# Patient Record
Sex: Male | Born: 1985 | Race: Black or African American | Hispanic: No | Marital: Single | Smoking: Former smoker
Health system: Southern US, Community
[De-identification: ages and names within clinical notes are randomized; demographics above are authoritative.]

## PROBLEM LIST (undated history)

## (undated) HISTORY — PX: REPLACEMENT TOTAL HIP W/  RESURFACING IMPLANTS: SUR1222

## (undated) HISTORY — PX: ANKLE SURGERY: SHX546

---

## 2017-12-24 ENCOUNTER — Other Ambulatory Visit: Payer: Self-pay

## 2017-12-24 ENCOUNTER — Emergency Department (HOSPITAL_COMMUNITY)
Admission: EM | Admit: 2017-12-24 | Discharge: 2017-12-24 | Disposition: A | Discharge status: Home / Self Care | Payer: Medicaid - Out of State | Attending: Emergency Medicine | Admitting: Emergency Medicine

## 2017-12-24 ENCOUNTER — Emergency Department (HOSPITAL_COMMUNITY): Payer: Medicaid - Out of State

## 2017-12-24 ENCOUNTER — Encounter (HOSPITAL_COMMUNITY): Payer: Self-pay | Admitting: Emergency Medicine

## 2017-12-24 DIAGNOSIS — R1032 Left lower quadrant pain: Secondary | ICD-10-CM | POA: Diagnosis present

## 2017-12-24 DIAGNOSIS — K5732 Diverticulitis of large intestine without perforation or abscess without bleeding: Secondary | ICD-10-CM | POA: Insufficient documentation

## 2017-12-24 DIAGNOSIS — Z87891 Personal history of nicotine dependence: Secondary | ICD-10-CM | POA: Diagnosis not present

## 2017-12-24 LAB — URINALYSIS, ROUTINE W REFLEX MICROSCOPIC
Bilirubin Urine: NEGATIVE
Glucose, UA: NEGATIVE mg/dL
Hgb urine dipstick: NEGATIVE
Ketones, ur: NEGATIVE mg/dL
LEUKOCYTES UA: NEGATIVE
NITRITE: NEGATIVE
PH: 5 (ref 5.0–8.0)
Protein, ur: NEGATIVE mg/dL
SPECIFIC GRAVITY, URINE: 1.023 (ref 1.005–1.030)

## 2017-12-24 MED ORDER — METRONIDAZOLE 500 MG PO TABS: 500.0000 mg | ORAL_TABLET | Freq: Two times a day (BID) | ORAL | 0 refills | Status: AC

## 2017-12-24 MED ORDER — KETOROLAC TROMETHAMINE 30 MG/ML IJ SOLN
30.0000 mg | Freq: Once | INTRAMUSCULAR | Status: AC
  Administered 2017-12-24: 30 mg via INTRAMUSCULAR
  Filled 2017-12-24: qty 1

## 2017-12-24 MED ORDER — CIPROFLOXACIN HCL 500 MG PO TABS: 500.0000 mg | ORAL_TABLET | Freq: Two times a day (BID) | ORAL | 0 refills | Status: AC

## 2017-12-24 MED ORDER — ONDANSETRON 4 MG PO TBDP: 4.0000 mg | ORAL_TABLET | Freq: Three times a day (TID) | ORAL | 0 refills | Status: DC | PRN

## 2017-12-24 MED ORDER — METRONIDAZOLE 500 MG PO TABS
500.0000 mg | ORAL_TABLET | Freq: Once | ORAL | Status: AC
  Administered 2017-12-24: 500 mg via ORAL
  Filled 2017-12-24: qty 1

## 2017-12-24 MED ORDER — KETOROLAC TROMETHAMINE 30 MG/ML IJ SOLN: 30.0000 mg | Freq: Once | INTRAMUSCULAR | Status: DC

## 2017-12-24 MED ORDER — TRAMADOL HCL 50 MG PO TABS: 50.0000 mg | ORAL_TABLET | Freq: Four times a day (QID) | ORAL | 0 refills | Status: DC | PRN

## 2017-12-24 MED ORDER — CIPROFLOXACIN HCL 250 MG PO TABS
500.0000 mg | ORAL_TABLET | Freq: Once | ORAL | Status: AC
  Administered 2017-12-24: 500 mg via ORAL
  Filled 2017-12-24: qty 2

## 2017-12-24 NOTE — Discharge Instructions (Signed)
We believe your symptoms are caused by diverticulitis.  Most of the time this condition (please read through the included information) can be cured with outpatient antibiotics.  Please take the full course of prescribed medication(s) and follow up with the doctors recommended above. ° °Return to the ED if your abdominal pain worsens or fails to improve, you develop bloody vomiting, bloody diarrhea, you are unable to tolerate fluids due to vomiting, fever greater than 101, or other symptoms that concern you. ° °Take Tramadol as prescribed for severe pain. Do not drink alcohol, drive or participate in any other potentially dangerous activities while taking this medication as it may make you sleepy. Do not take this medication with any other sedating medications, either prescription or over-the-counter.  °  °This medication is an opiate (or narcotic) pain medication and can be habit forming.  Use it as little as possible to achieve adequate pain control.  Do not use or use it with extreme caution if you have a history of opiate abuse or dependence. This medication is intended for your use only - do not give any to anyone else and keep it in a secure place where nobody else, especially children, have access to it.  It will also cause or worsen constipation, so you may want to consider taking an over-the-counter stool softener while you are taking this medication. ° ° °Diverticulitis °Diverticulitis is inflammation or infection of small pouches in your colon that form when you have a condition called diverticulosis. The pouches in your colon are called diverticula. Your colon, or large intestine, is where water is absorbed and stool is formed. °Complications of diverticulitis can include: °Bleeding. °Severe infection. °Severe pain. °Perforation of your colon. °Obstruction of your colon. °CAUSES  °Diverticulitis is caused by bacteria. °Diverticulitis happens when stool becomes trapped in diverticula. This allows bacteria  to grow in the diverticula, which can lead to inflammation and infection. °RISK FACTORS °People with diverticulosis are at risk for diverticulitis. Eating a diet that does not include enough fiber from fruits and vegetables may make diverticulitis more likely to develop. °SYMPTOMS  °Symptoms of diverticulitis may include: °Abdominal pain and tenderness. The pain is normally located on the left side of the abdomen, but may occur in other areas. °Fever and chills. °Bloating. °Cramping. °Nausea. °Vomiting. °Constipation. °Diarrhea. °Blood in your stool. °DIAGNOSIS  °Your health care provider will ask you about your medical history and do a physical exam. You may need to have tests done because many medical conditions can cause the same symptoms as diverticulitis. Tests may include: °Blood tests. °Urine tests. °Imaging tests of the abdomen, including X-rays and CT scans. °When your condition is under control, your health care provider may recommend that you have a colonoscopy. A colonoscopy can show how severe your diverticula are and whether something else is causing your symptoms. °TREATMENT  °Most cases of diverticulitis are mild and can be treated at home. Treatment may include: °Taking over-the-counter pain medicines. °Following a clear liquid diet. °Taking antibiotic medicines by mouth for 7-10 days. °More severe cases may be treated at a hospital. Treatment may include: °Not eating or drinking. °Taking prescription pain medicine. °Receiving antibiotic medicines through an IV tube. °Receiving fluids and nutrition through an IV tube. °Surgery. °HOME CARE INSTRUCTIONS  °Follow your health care provider's instructions carefully. °Follow a full liquid diet or other diet as directed by your health care provider. After your symptoms improve, your health care provider may tell you to change your diet. He or   she may recommend you eat a high-fiber diet. Fruits and vegetables are good sources of fiber. Fiber makes it easier  to pass stool. °Take fiber supplements or probiotics as directed by your health care provider. °Only take medicines as directed by your health care provider. °Keep all your follow-up appointments. °SEEK MEDICAL CARE IF:  °Your pain does not improve. °You have a hard time eating food. °Your bowel movements do not return to normal. °SEEK IMMEDIATE MEDICAL CARE IF:  °Your pain becomes worse. °Your symptoms do not get better. °Your symptoms suddenly get worse. °You have a fever. °You have repeated vomiting. °You have bloody or black, tarry stools. °MAKE SURE YOU:  °Understand these instructions. °Will watch your condition. °Will get help right away if you are not doing well or get worse. °Document Released: 11/18/2004 Document Revised: 02/13/2013 Document Reviewed: 01/03/2013 °[employer]® Patient Information ©2015 [employer]. This information is not intended to replace advice given to you by your health care provider. Make sure you discuss any questions you have with your health care provider. ° ° °

## 2017-12-24 NOTE — ED Provider Notes (Signed)
Emergency Department Provider Note   I have reviewed the triage vital signs and the nursing notes.   HISTORY  Chief Complaint Back Pain (L back pain)   HPI Jack Myers is a 32 y.o. male with PMH of hip surgery presents to the emergency department for evaluation of left lower quadrant/flank pain.  Symptoms have been worsening over the past 2 days.  Denies specific back pain or radiation of symptoms down the leg.  No numbness or weakness.  Patient states he has taken several pain medications yesterday with no lasting relief in symptoms.  Denies any dysuria, hesitancy, urgency.  Has not experienced any hematuria.  No prior history of kidney stone. Denies diarrhea symptoms are vomiting  History reviewed. No pertinent past medical history.  There are no active problems to display for this patient.   Past Surgical History:  Procedure Laterality Date  . ANKLE SURGERY    . REPLACEMENT TOTAL HIP W/  RESURFACING IMPLANTS      Allergies Penicillins  History reviewed. No pertinent family history.  Social History Social History   Tobacco Use  . Smoking status: Former Games developer  . Smokeless tobacco: Never Used  Substance Use Topics  . Alcohol use: Yes  . Drug use: Not Currently    Review of Systems  Constitutional: No fever/chills Eyes: No visual changes. ENT: No sore throat. Cardiovascular: Denies chest pain. Respiratory: Denies shortness of breath. Gastrointestinal: Positive LLQ abdominal pain.  No nausea, no vomiting.  No diarrhea.  No constipation. Genitourinary: Negative for dysuria. Musculoskeletal: Negative for back pain. Skin: Negative for rash. Neurological: Negative for headaches, focal weakness or numbness.  10-point ROS otherwise negative.  ____________________________________________   PHYSICAL EXAM:  VITAL SIGNS: ED Triage Vitals  Enc Vitals Group     BP 12/24/17 1515 131/71     Pulse Rate 12/24/17 1515 86     Resp 12/24/17 1515 18     Temp  12/24/17 1515 99.2 F (37.3 C)     Temp Source 12/24/17 1515 Oral     SpO2 12/24/17 1515 99 %     Weight 12/24/17 1516 240 lb (108.9 kg)     Height 12/24/17 1516 5\' 6"  (1.676 m)     Pain Score 12/24/17 1516 10   Constitutional: Alert and oriented. Well appearing and in no acute distress. Eyes: Conjunctivae are normal.  Head: Atraumatic. Nose: No congestion/rhinnorhea. Mouth/Throat: Mucous membranes are moist.  Neck: No stridor.   Cardiovascular: Normal rate, regular rhythm. Good peripheral circulation. Grossly normal heart sounds.   Respiratory: Normal respiratory effort.  No retractions. Lungs CTAB. Gastrointestinal: Soft with focal LLQ tenderness along with inguinal crease and left flank. No CVA tenderness to percussion. No distention.  Musculoskeletal: No lower extremity tenderness nor edema. No gross deformities of extremities. No midline thoracic or lumbar spine tenderness.  Neurologic:  Normal speech and language. No gross focal neurologic deficits are appreciated.  Skin:  Skin is warm, dry and intact. No rash noted.  ____________________________________________   LABS (all labs ordered are listed, but only abnormal results are displayed)  Labs Reviewed  URINALYSIS, ROUTINE W REFLEX MICROSCOPIC   ____________________________________________  RADIOLOGY  Ct Renal Stone Study  Result Date: 12/24/2017 CLINICAL DATA:  Left-sided flank pain for 2 days EXAM: CT ABDOMEN AND PELVIS WITHOUT CONTRAST TECHNIQUE: Multidetector CT imaging of the abdomen and pelvis was performed following the standard protocol without IV contrast. COMPARISON:  None. FINDINGS: Lower chest: Mild bibasilar atelectatic changes are noted without sizable effusion. Hepatobiliary: No  focal liver abnormality is seen. No gallstones, gallbladder wall thickening, or biliary dilatation. Pancreas: Unremarkable. No pancreatic ductal dilatation or surrounding inflammatory changes. Spleen: Normal in size without focal  abnormality. Adrenals/Urinary Tract: Adrenal glands are within normal limits bilaterally. Kidneys are well visualized bilaterally without evidence of renal calculi or obstructive changes. The bladder is decompressed. Stomach/Bowel: Changes of diverticulitis are noted in the junction of the descending and sigmoid colon. No abscess or perforation is noted. The appendix is within normal limits. No small bowel abnormality is seen. Vascular/Lymphatic: No significant vascular findings are present. No enlarged abdominal or pelvic lymph nodes. Reproductive: Prostate is unremarkable. Other: No abdominal wall hernia or abnormality. No abdominopelvic ascites. Musculoskeletal: Postsurgical changes are noted in the proximal right femur as well as the acetabulum on the left. IMPRESSION: Changes consistent with diverticulitis at the junction of descending and sigmoid colon. No perforation or abscess formation is noted. Mild bibasilar atelectatic changes. Electronically Signed   By: Alcide Clever M.D.   On: 12/24/2017 17:24    ____________________________________________   PROCEDURES  Procedure(s) performed:   Procedures  None ____________________________________________   INITIAL IMPRESSION / ASSESSMENT AND PLAN / ED COURSE  Pertinent labs & imaging results that were available during my care of the patient were reviewed by me and considered in my medical decision making (see chart for details).  Patient presents to the emergency department for evaluation of left lower quadrant abdominal/flank pain.  He has tenderness on exam.  I do not appreciate any bruising over this area.  The tenderness does seem more superficial but difficult to evaluate.  Patient does not have lower back pain as listed in his chief complaint.  He is not experiencing any sciatica symptoms.  I did discuss red flag symptoms that would suggest acute spine emergency and these were negative. Plan for UA and CT renal. No testicular pain  symptoms.   5:41 PM Scan reviewed which shows acute, uncomplicated diverticulitis.  Patient is very well-appearing.  I discussed the diagnosis with him.  Plan for Cipro and Flagyl at home along with pain and nausea medication.  I did provide contact information for a PCP as well as local gastroenterology and encouraged him to call these providers on Monday to schedule outpatient appointments moving forward.  We also discussed the signs and symptoms of worsening/complicated diverticulitis.   At this time, I do not feel there is any life-threatening condition present. I have reviewed and discussed all results (EKG, imaging, lab, urine as appropriate), exam findings with patient. I have reviewed nursing notes and appropriate previous records.  I feel the patient is safe to be discharged home without further emergent workup. Discussed usual and customary return precautions. Patient and family (if present) verbalize understanding and are comfortable with this plan.  Patient will follow-up with their primary care provider. If they do not have a primary care provider, information for follow-up has been provided to them. All questions have been answered.  ____________________________________________  FINAL CLINICAL IMPRESSION(S) / ED DIAGNOSES  Final diagnoses:  Diverticulitis of large intestine without perforation or abscess without bleeding     MEDICATIONS GIVEN DURING THIS VISIT:  Medications  ciprofloxacin (CIPRO) tablet 500 mg (has no administration in time range)  metroNIDAZOLE (FLAGYL) tablet 500 mg (has no administration in time range)  ketorolac (TORADOL) 30 MG/ML injection 30 mg (30 mg Intramuscular Given 12/24/17 1619)     NEW OUTPATIENT MEDICATIONS STARTED DURING THIS VISIT:  New Prescriptions   CIPROFLOXACIN (CIPRO) 500 MG TABLET  Take 1 tablet (500 mg total) by mouth 2 (two) times daily for 7 days.   METRONIDAZOLE (FLAGYL) 500 MG TABLET    Take 1 tablet (500 mg total) by mouth 2  (two) times daily for 7 days.   ONDANSETRON (ZOFRAN ODT) 4 MG DISINTEGRATING TABLET    Take 1 tablet (4 mg total) by mouth every 8 (eight) hours as needed for nausea or vomiting.   TRAMADOL (ULTRAM) 50 MG TABLET    Take 1 tablet (50 mg total) by mouth every 6 (six) hours as needed for severe pain.    Note:  This document was prepared using Dragon voice recognition software and may include unintentional dictation errors.  Alona Bene, MD Emergency Medicine    Zaida Reiland, Arlyss Repress, MD 12/24/17 [phone]

## 2017-12-24 NOTE — ED Triage Notes (Signed)
2 day hx of back pain to the left side   States "took a cocktail" of pain meds yesterday  Is followed in [place]  Denies any urinary sx

## 2018-01-09 ENCOUNTER — Other Ambulatory Visit: Payer: Self-pay

## 2018-01-09 ENCOUNTER — Emergency Department (HOSPITAL_COMMUNITY)
Admission: EM | Admit: 2018-01-09 | Discharge: 2018-01-09 | Disposition: A | Discharge status: Home / Self Care | Payer: Medicaid - Out of State | Attending: Emergency Medicine | Admitting: Emergency Medicine

## 2018-01-09 ENCOUNTER — Encounter (HOSPITAL_COMMUNITY): Payer: Self-pay | Admitting: Emergency Medicine

## 2018-01-09 DIAGNOSIS — J111 Influenza due to unidentified influenza virus with other respiratory manifestations: Secondary | ICD-10-CM

## 2018-01-09 DIAGNOSIS — Z87891 Personal history of nicotine dependence: Secondary | ICD-10-CM | POA: Insufficient documentation

## 2018-01-09 DIAGNOSIS — Z79899 Other long term (current) drug therapy: Secondary | ICD-10-CM | POA: Diagnosis not present

## 2018-01-09 DIAGNOSIS — R69 Illness, unspecified: Secondary | ICD-10-CM

## 2018-01-09 DIAGNOSIS — Z96649 Presence of unspecified artificial hip joint: Secondary | ICD-10-CM | POA: Insufficient documentation

## 2018-01-09 DIAGNOSIS — R52 Pain, unspecified: Secondary | ICD-10-CM | POA: Diagnosis present

## 2018-01-09 MED ORDER — IBUPROFEN 800 MG PO TABS: 800.0000 mg | ORAL_TABLET | Freq: Three times a day (TID) | ORAL | 0 refills | Status: DC

## 2018-01-09 MED ORDER — OSELTAMIVIR PHOSPHATE 75 MG PO CAPS: 75.0000 mg | ORAL_CAPSULE | Freq: Two times a day (BID) | ORAL | 0 refills | Status: DC

## 2018-01-09 NOTE — Discharge Instructions (Addendum)
Drink plenty of fluids.  Tylenol every 4 hours.  Take the Tamiflu as directed until its finished.  Follow-up with 1 of the clinics listed if needed or return to the ER for any worsening symptoms

## 2018-01-09 NOTE — ED Triage Notes (Signed)
Pt C/O body aches, fever, and sore throat. Pt states family member in the house is going through same.

## 2018-01-10 NOTE — ED Provider Notes (Signed)
[facility] Provider Note   CSN: [id number] Arrival date & time: 01/09/18  1931     History   Chief Complaint Chief Complaint  Patient presents with  . Influenza    HPI Jack Myers is a 32 y.o. male.  HPI  Jack Myers is a 32 y.o. male who presents to the Emergency Department complaining of generalized body aches, intermittent fever, chills, and sweats, and sore throat.  Family members also here with similar symptoms.  Symptoms began 2 days ago.  He also reports intermittent cough that is nonproductive.  No shortness of breath, abdominal pain, or vomiting.  He has not tried any over-the-counter medications for symptomatic relief.  He states he is still eating and drinking normally.  Nothing makes his symptoms better or worse.     History reviewed. No pertinent past medical history.  There are no active problems to display for this patient.   Past Surgical History:  Procedure Laterality Date  . ANKLE SURGERY    . REPLACEMENT TOTAL HIP W/  RESURFACING IMPLANTS       Home Medications    Prior to Admission medications   Medication Sig Start Date End Date Taking? Authorizing Provider  ibuprofen (ADVIL,MOTRIN) 800 MG tablet Take 1 tablet (800 mg total) by mouth 3 (three) times daily. 01/09/18   Liridona Mashaw, PA-C  ondansetron (ZOFRAN ODT) 4 MG disintegrating tablet Take 1 tablet (4 mg total) by mouth every 8 (eight) hours as needed for nausea or vomiting. 12/24/17   Long, Arlyss RepressJoshua G, MD  oseltamivir (TAMIFLU) 75 MG capsule Take 1 capsule (75 mg total) by mouth 2 (two) times daily. 01/09/18   Lakeria Starkman, PA-C  traMADol (ULTRAM) 50 MG tablet Take 1 tablet (50 mg total) by mouth every 6 (six) hours as needed for severe pain. 12/24/17   Long, Arlyss RepressJoshua G, MD    Family History No family history on file.  Social History Social History   Tobacco Use  . Smoking status: Former Games developermoker  . Smokeless tobacco: Never Used  Substance Use Topics  .  Alcohol use: Yes  . Drug use: Not Currently     Allergies   Penicillins   Review of Systems Review of Systems  Constitutional: Negative for activity change, appetite change, chills and fever.  HENT: Positive for congestion and sore throat. Negative for facial swelling, rhinorrhea, trouble swallowing and voice change.   Eyes: Negative for visual disturbance.  Respiratory: Positive for cough. Negative for shortness of breath, wheezing and stridor.   Cardiovascular: Negative for chest pain.  Gastrointestinal: Negative for abdominal pain, nausea and vomiting.  Genitourinary: Negative for dysuria and flank pain.  Musculoskeletal: Positive for myalgias. Negative for arthralgias, neck pain and neck stiffness.  Skin: Negative for rash.  Neurological: Negative for dizziness, weakness, numbness and headaches.  Hematological: Negative for adenopathy.  Psychiatric/Behavioral: Negative for confusion.     Physical Exam Updated Vital Signs BP (!) 152/92 (BP Location: Right Arm)   Pulse 91   Temp 98.1 F (36.7 C) (Oral)   Resp 14   SpO2 97%   Physical Exam  Constitutional: He is oriented to person, place, and time. He appears well-developed and well-nourished. No distress.  HENT:  Head: Normocephalic and atraumatic.  Right Ear: Tympanic membrane and ear canal normal.  Left Ear: Tympanic membrane and ear canal normal.  Nose: Mucosal edema and rhinorrhea present.  Mouth/Throat: Uvula is midline and mucous membranes are normal. No trismus in the jaw. No uvula swelling. Posterior  oropharyngeal erythema present. No oropharyngeal exudate, posterior oropharyngeal edema or tonsillar abscesses.  Eyes: Conjunctivae are normal.  Neck: Normal range of motion and phonation normal. Neck supple.  Cardiovascular: Normal rate, regular rhythm and intact distal pulses.  No murmur heard. Pulmonary/Chest: Effort normal and breath sounds normal. No respiratory distress. He has no wheezes. He has no rales.    Abdominal: Soft. He exhibits no distension. There is no tenderness. There is no rebound and no guarding.  Musculoskeletal: Normal range of motion. He exhibits no edema.  Lymphadenopathy:    He has no cervical adenopathy.  Neurological: He is alert and oriented to person, place, and time. He exhibits normal muscle tone. Coordination normal.  Skin: Skin is warm. No rash noted.  Psychiatric: He has a normal mood and affect.  Nursing note and vitals reviewed.    ED Treatments / Results  Labs (all labs ordered are listed, but only abnormal results are displayed) Labs Reviewed - No data to display  EKG None  Radiology No results found.  Procedures Procedures (including critical care time)  Medications Ordered in ED Medications - No data to display   Initial Impression / Assessment and Plan / ED Course  I have reviewed the triage vital signs and the nursing notes.  Pertinent labs & imaging results that were available during my care of the patient were reviewed by me and considered in my medical decision making (see chart for details).     Patient nontoxic-appearing.  Vitals reassuring.  Patient's family member here for evaluation of flulike symptoms.  Patient requesting prescription for Tamiflu.  I feel that his symptoms are likely viral.  He appears appropriate for discharge home.  Return precautions discussed.  Final Clinical Impressions(s) / ED Diagnoses   Final diagnoses:  Influenza-like illness    ED Discharge Orders         Ordered    oseltamivir (TAMIFLU) 75 MG capsule  2 times daily     01/09/18 2044    ibuprofen (ADVIL,MOTRIN) 800 MG tablet  3 times daily     01/09/18 2044           Pauline Aus, PA-C 01/10/18 1651    Long, Arlyss Repress, MD 01/11/18 [phone]

## 2018-03-06 ENCOUNTER — Emergency Department (HOSPITAL_COMMUNITY)
Admission: EM | Admit: 2018-03-06 | Discharge: 2018-03-07 | Disposition: A | Discharge status: Home / Self Care | Payer: Medicaid - Out of State | Attending: Emergency Medicine | Admitting: Emergency Medicine

## 2018-03-06 ENCOUNTER — Emergency Department (HOSPITAL_COMMUNITY): Payer: Medicaid - Out of State

## 2018-03-06 ENCOUNTER — Other Ambulatory Visit: Payer: Self-pay

## 2018-03-06 ENCOUNTER — Encounter (HOSPITAL_COMMUNITY): Payer: Self-pay | Admitting: Emergency Medicine

## 2018-03-06 DIAGNOSIS — Z96649 Presence of unspecified artificial hip joint: Secondary | ICD-10-CM | POA: Insufficient documentation

## 2018-03-06 DIAGNOSIS — M25552 Pain in left hip: Secondary | ICD-10-CM | POA: Diagnosis not present

## 2018-03-06 DIAGNOSIS — M79605 Pain in left leg: Secondary | ICD-10-CM | POA: Diagnosis present

## 2018-03-06 DIAGNOSIS — Z87891 Personal history of nicotine dependence: Secondary | ICD-10-CM | POA: Insufficient documentation

## 2018-03-06 DIAGNOSIS — M25562 Pain in left knee: Secondary | ICD-10-CM | POA: Insufficient documentation

## 2018-03-06 MED ORDER — KETOROLAC TROMETHAMINE 30 MG/ML IJ SOLN
30.0000 mg | Freq: Once | INTRAMUSCULAR | Status: AC
  Administered 2018-03-07: 30 mg via INTRAMUSCULAR
  Filled 2018-03-06: qty 1

## 2018-03-06 MED ORDER — IBUPROFEN 800 MG PO TABS: 800.0000 mg | ORAL_TABLET | Freq: Three times a day (TID) | ORAL | 0 refills | Status: DC

## 2018-03-06 MED ORDER — HYDROCODONE-ACETAMINOPHEN 5-325 MG PO TABS: 1.0000 | ORAL_TABLET | ORAL | 0 refills | Status: AC | PRN

## 2018-03-06 NOTE — ED Triage Notes (Signed)
Pt C/O left hip and knee pain that this morning. Pt denies injury.

## 2018-03-07 DIAGNOSIS — M25552 Pain in left hip: Secondary | ICD-10-CM | POA: Diagnosis not present

## 2018-03-07 NOTE — ED Notes (Signed)
Pt ambulatory to waiting room. Pt verbalized understanding of discharge instructions.   
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
## 2018-03-07 NOTE — Discharge Instructions (Addendum)
As discussed your x-rays are negative tonight for any obvious source of your pain symptoms.  The bones and your surgical hardware appear intact and healthy.  You may use the crutches given for minimization of pain in this leg.  You may take the medications prescribed for pain and inflammation.  Call your doctor for recheck of your symptoms and/or a referral back to your orthopedist if your symptoms do not resolve with this treatment.  Do not drive within 4 hours of taking hydrocodone as this medication will make you drowsy.

## 2018-03-07 NOTE — ED Notes (Signed)
Pt given pre pak of norco.

## 2018-03-09 MED FILL — Hydrocodone-Acetaminophen Tab 5-325 MG: ORAL | Qty: 6 | Status: AC

## 2019-10-29 ENCOUNTER — Emergency Department (HOSPITAL_COMMUNITY)
Admission: EM | Admit: 2019-10-29 | Discharge: 2019-10-29 | Disposition: A | Discharge status: Home / Self Care | Payer: Medicaid - Out of State | Attending: Emergency Medicine | Admitting: Emergency Medicine

## 2019-10-29 ENCOUNTER — Other Ambulatory Visit: Payer: Self-pay

## 2019-10-29 ENCOUNTER — Encounter (HOSPITAL_COMMUNITY): Payer: Self-pay | Admitting: *Deleted

## 2019-10-29 DIAGNOSIS — M25552 Pain in left hip: Secondary | ICD-10-CM | POA: Diagnosis present

## 2019-10-29 DIAGNOSIS — G8929 Other chronic pain: Secondary | ICD-10-CM | POA: Insufficient documentation

## 2019-10-29 DIAGNOSIS — M25572 Pain in left ankle and joints of left foot: Secondary | ICD-10-CM | POA: Insufficient documentation

## 2019-10-29 DIAGNOSIS — Z87891 Personal history of nicotine dependence: Secondary | ICD-10-CM | POA: Diagnosis not present

## 2019-10-29 MED ORDER — DICLOFENAC SODIUM 75 MG PO TBEC: 75.0000 mg | DELAYED_RELEASE_TABLET | Freq: Two times a day (BID) | ORAL | 0 refills | Status: DC

## 2019-10-29 MED ORDER — DICLOFENAC SODIUM 75 MG PO TBEC: 75.0000 mg | DELAYED_RELEASE_TABLET | Freq: Two times a day (BID) | ORAL | 0 refills | Status: AC

## 2019-10-29 NOTE — ED Provider Notes (Signed)
[facility] EMERGENCY DEPARTMENT Provider Note   CSN: [id number] Arrival date & time: 10/29/19  1007     History Chief Complaint  Patient presents with  . Hip Pain    Jack Myers is a 34 y.o. male.  HPI      Jack Myers is a 34 y.o. male with past medical history of orthopedic repair of a left acetabular fracture and surgical pinning of the left ankle in 2014 who presents to the Emergency Department complaining of gradually worsening pain of his ankle and hip for 1 month.  He states that he has ongoing pain since his surgery, but has not been able to see his primary care provider due to loss of insurance and his orthopedic provider has released him.  He denies recent injury.  He states he has a dull pain to the lateral left hip and left ankle that is constant, but worsens with certain movements and weightbearing.  He denies abdominal pain, fever, chills, back pain, swelling or redness of the hip or ankle, urine or bowel changes.  He also describes having intermittent numbness and tingling to the toes of the left foot.  This is positional.    History reviewed. No pertinent past medical history.  There are no problems to display for this patient.   Past Surgical History:  Procedure Laterality Date  . ANKLE SURGERY    . REPLACEMENT TOTAL HIP W/  RESURFACING IMPLANTS       History reviewed. No pertinent family history.  Social History   Tobacco Use  . Smoking status: Former Games developer  . Smokeless tobacco: Never Used  Vaping Use  . Vaping Use: Never used  Substance Use Topics  . Alcohol use: Yes  . Drug use: Not Currently    Home Medications Prior to Admission medications   Medication Sig Start Date End Date Taking? Authorizing Provider  HYDROcodone-acetaminophen (NORCO/VICODIN) 5-325 MG tablet Take 1 tablet by mouth every 4 (four) hours as needed. 03/06/18   Burgess Amor, PA-C  HYDROcodone-acetaminophen (NORCO/VICODIN) 5-325 MG tablet Take 1 tablet by mouth every 4  (four) hours as needed. 03/06/18   Idol, Raynelle Fanning, PA-C  ibuprofen (ADVIL,MOTRIN) 800 MG tablet Take 1 tablet (800 mg total) by mouth 3 (three) times daily. 03/06/18   Burgess Amor, PA-C    Allergies    Penicillins  Review of Systems   Review of Systems  Constitutional: Negative for chills and fever.  Respiratory: Negative for shortness of breath.   Cardiovascular: Negative for chest pain.  Gastrointestinal: Negative for abdominal pain, nausea and vomiting.  Genitourinary: Negative for difficulty urinating, dysuria and flank pain.  Musculoskeletal: Positive for arthralgias (left hip and ankle pain). Negative for back pain, joint swelling and neck pain.  Skin: Negative for color change, rash and wound.  Neurological: Negative for dizziness and weakness.    Physical Exam Updated Vital Signs BP 117/66 (BP Location: Right Arm)   Pulse 65   Temp 99 F (37.2 C) (Oral)   Resp 18   Ht 5\' 6"  (1.676 m)   Wt 71.2 kg   SpO2 97%   BMI 25.34 kg/m   Physical Exam Vitals and nursing note reviewed.  Constitutional:      Appearance: Normal appearance. He is not ill-appearing or toxic-appearing.     Comments: Pt smells of marijuana  Cardiovascular:     Rate and Rhythm: Normal rate and regular rhythm.     Pulses: Normal pulses.  Pulmonary:     Effort: Pulmonary effort is  normal. No respiratory distress.     Breath sounds: Normal breath sounds.  Abdominal:     General: There is no distension.     Palpations: Abdomen is soft.     Tenderness: There is no abdominal tenderness. There is no right CVA tenderness or left CVA tenderness.  Musculoskeletal:        General: Tenderness present. No swelling.     Right lower leg: No edema.     Left lower leg: No edema.     Comments: ttp of the lateral left hip.  Pain with external rotation and click is present.  No rash, erythema or excessive warmth of the joint.  Diffuse ttp of the medial and lateral left ankle.  Well healed surgical scar of the medial  ankle.  No edema or erythema  Skin:    General: Skin is warm.     Capillary Refill: Capillary refill takes less than 2 seconds.     Findings: No erythema or rash.  Neurological:     General: No focal deficit present.     Mental Status: He is alert.     Sensory: No sensory deficit.     Motor: No weakness.     ED Results / Procedures / Treatments   Labs (all labs ordered are listed, but only abnormal results are displayed) Labs Reviewed - No data to display  EKG None  Radiology No results found.  Procedures Procedures (including critical care time)  Medications Ordered in ED Medications - No data to display  ED Course  I have reviewed the triage vital signs and the nursing notes.  Pertinent labs & imaging results that were available during my care of the patient were reviewed by me and considered in my medical decision making (see chart for details).    MDM Rules/Calculators/A&P                          Pt here with likely acute on chronic left hip and ankle pain.  NV intact.  No recent injuries, no concerning exam findings to suggest septic joint.    On review of medical records, he had imaging of the left hip in 02/2018 that showed intact hardware.  No exam findings today that suggest need for repeat imaging.  He states that he has a PCP in [place], he does not currently see pain management and he states he has been released from his orthopedic provider.  I feel that he would likely benefit from seeing pain management.  I have discussed this with him and he agrees to arrange follow-up.  He appears appropriate for discharge home, doubt emergent process.  Return precautions discussed.  Final Clinical Impression(s) / ED Diagnoses Final diagnoses:  Chronic left hip pain  Chronic pain of left ankle    Rx / DC Orders ED Discharge Orders    None       Pauline Aus, PA-C 10/29/19 1245    Raeford Razor, MD 10/30/19 1018

## 2019-10-29 NOTE — ED Notes (Signed)
Pt reports hip replacement previously  Has seen his PCP in [place] as well as an ortho he was referred to   Pt has pungent odor of marijuana   Has put on an exam glove over his R hand  Here for eval

## 2019-10-29 NOTE — ED Triage Notes (Signed)
Pt c/o left hip and ankle pain x one month; pt states he has had a hip replacement to same hip in the past

## 2019-10-29 NOTE — Discharge Instructions (Signed)
Follow-up with your primary care provider or you may try contacting your orthopedic surgeon at [facility]. You may benefit from seeing a pain management provider.

## 2020-04-20 ENCOUNTER — Other Ambulatory Visit: Payer: Self-pay

## 2020-04-20 ENCOUNTER — Emergency Department (HOSPITAL_COMMUNITY)
Admission: EM | Admit: 2020-04-20 | Discharge: 2020-04-20 | Disposition: A | Discharge status: Home / Self Care | Payer: Medicaid - Out of State | Attending: Emergency Medicine | Admitting: Emergency Medicine

## 2020-04-20 ENCOUNTER — Encounter (HOSPITAL_COMMUNITY): Payer: Self-pay

## 2020-04-20 DIAGNOSIS — F419 Anxiety disorder, unspecified: Secondary | ICD-10-CM | POA: Insufficient documentation

## 2020-04-20 DIAGNOSIS — Z96649 Presence of unspecified artificial hip joint: Secondary | ICD-10-CM | POA: Insufficient documentation

## 2020-04-20 DIAGNOSIS — Z87891 Personal history of nicotine dependence: Secondary | ICD-10-CM | POA: Diagnosis not present

## 2020-04-20 DIAGNOSIS — R0602 Shortness of breath: Secondary | ICD-10-CM | POA: Insufficient documentation

## 2020-04-20 DIAGNOSIS — R110 Nausea: Secondary | ICD-10-CM | POA: Diagnosis not present

## 2020-04-20 DIAGNOSIS — R079 Chest pain, unspecified: Secondary | ICD-10-CM | POA: Insufficient documentation

## 2020-04-20 MED ORDER — HYDROXYZINE HCL 25 MG PO TABS: 25.0000 mg | ORAL_TABLET | Freq: Four times a day (QID) | ORAL | 0 refills | Status: AC | PRN

## 2020-04-20 MED ORDER — LORAZEPAM 1 MG PO TABS
1.0000 mg | ORAL_TABLET | Freq: Once | ORAL | Status: AC
  Administered 2020-04-20: 1 mg via ORAL
  Filled 2020-04-20: qty 1

## 2020-04-20 MED ORDER — KETOROLAC TROMETHAMINE 60 MG/2ML IM SOLN
30.0000 mg | Freq: Once | INTRAMUSCULAR | Status: AC
  Administered 2020-04-20: 30 mg via INTRAMUSCULAR
  Filled 2020-04-20: qty 2

## 2020-04-20 NOTE — ED Triage Notes (Addendum)
Pt from home presents to ED today for chest pain. Pt says he was seen last night at [facility] ED for the same and all tests came back normal, but says they told him if the chest pain returned to come back to ED, but he decided to come here instead. Pt was also seen a second time yesterday at [facility] ED for anxiety, but left AMA. Pain is reproducible with palpation.

## 2020-04-20 NOTE — ED Provider Notes (Signed)
[facility] EMERGENCY DEPARTMENT Provider Note   CSN: [id number] Arrival date & time: 04/20/20  0120     History Chief Complaint  Patient presents with  . Chest Pain    Jack Myers is a 35 y.o. male.  Mild left upper chest pain.  States he has a when he has anxiety.  He will have episodes where everything will get real quiet.  Then he will hear his heart beating and he will get nervous and his heart will beat faster than will start having some chest pain.  Symptoms of shortness of breath and nausea with it but not always.  Patient states this happens more often recently.  He has had in the past was on Vistaril for it but the prescription is old is not sure if it is working.  He stated he used to have some stronger he does not have any of that anymore.  He went to [facility] twice yesterday for the same complaints.  He had ACS work-up which was negative.  X-ray was negative.  Rest of work-up was unremarkable.  Patient states he felt better and he went home.  He came back the second time when he was is anxious to give him some type of pill that made him better and then he went home.  States it came back today so he decided come here for second opinion.  No recent travels, surgeries.  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  No alcohol or other drugs.  No trauma.   Chest Pain      History reviewed. No pertinent past medical history.  There are no problems to display for this patient.   Past Surgical History:  Procedure Laterality Date  . ANKLE SURGERY    . REPLACEMENT TOTAL HIP W/  RESURFACING IMPLANTS         No family history on file.  Social History   Tobacco Use  . Smoking status: Former Games developer  . Smokeless tobacco: Never Used  Vaping Use  . Vaping Use: Never used  Substance Use Topics  . Alcohol use: Not Currently    Home Medications Prior to Admission medications   Medication Sig Start Date End Date Taking? Authorizing Provider  hydrOXYzine  (ATARAX/VISTARIL) 25 MG tablet Take 1 tablet (25 mg total) by mouth every 6 (six) hours as needed for anxiety. 04/20/20  Yes Kaylise Blakeley, Barbara Cower, MD  diclofenac (VOLTAREN) 75 MG EC tablet Take 1 tablet (75 mg total) by mouth 2 (two) times daily. Take with food 10/29/19   Triplett, Tammy, PA-C  HYDROcodone-acetaminophen (NORCO/VICODIN) 5-325 MG tablet Take 1 tablet by mouth every 4 (four) hours as needed. 03/06/18   Burgess Amor, PA-C  HYDROcodone-acetaminophen (NORCO/VICODIN) 5-325 MG tablet Take 1 tablet by mouth every 4 (four) hours as needed. 03/06/18   Burgess Amor, PA-C    Allergies    Penicillins  Review of Systems   Review of Systems  Cardiovascular: Positive for chest pain.  All other systems reviewed and are negative.   Physical Exam Updated Vital Signs BP 111/63 (BP Location: Left Arm)   Pulse (!) 51   Temp 97.7 F (36.5 C) (Oral)   Resp 17   Ht 5\' 6"  (1.676 m)   Wt 71.2 kg   SpO2 100%   BMI 25.34 kg/m   Physical Exam Vitals and nursing note reviewed.  Constitutional:      Appearance: He is well-developed and well-nourished.  HENT:     Head: Normocephalic and atraumatic.  Mouth/Throat:     Mouth: Mucous membranes are moist.     Pharynx: Oropharynx is clear.  Eyes:     Pupils: Pupils are equal, round, and reactive to light.  Cardiovascular:     Rate and Rhythm: Normal rate.  Pulmonary:     Effort: Pulmonary effort is normal. No respiratory distress.  Abdominal:     General: There is no distension.     Palpations: Abdomen is soft.  Musculoskeletal:        General: Normal range of motion.     Cervical back: Normal range of motion.  Skin:    General: Skin is warm and dry.  Neurological:     General: No focal deficit present.     Mental Status: He is alert.     ED Results / Procedures / Treatments   Labs (all labs ordered are listed, but only abnormal results are displayed) Labs Reviewed - No data to display  EKG None  Radiology No results  found.  Procedures Procedures   Medications Ordered in ED Medications  ketorolac (TORADOL) injection 30 mg (has no administration in time range)  LORazepam (ATIVAN) tablet 1 mg (has no administration in time range)    ED Course  I have reviewed the triage vital signs and the nursing notes.  Pertinent labs & imaging results that were available during my care of the patient were reviewed by me and considered in my medical decision making (see chart for details).    MDM Rules/Calculators/A&P                          Likely anxiety. Full workup done yesterday, I don't see a reason to repeat it today. Doubt ACS/dissection/PTX/pneumonia or PE based on history and exam. Will treat symptomatically here, suggest PCP follow up for same.   Final Clinical Impression(s) / ED Diagnoses Final diagnoses:  Nonspecific chest pain    Rx / DC Orders ED Discharge Orders         Ordered    hydrOXYzine (ATARAX/VISTARIL) 25 MG tablet  Every 6 hours PRN        04/20/20 0212           Steffon Gladu, Barbara Cower, MD 04/20/20 8588

## 2020-08-01 IMAGING — DX DG KNEE COMPLETE 4+V*L*
4 series · 4 of 4 positions shown · non-contrast
Comparison: None.

CLINICAL DATA: Left hip and knee pain this morning. No injury.

EXAM:
LEFT KNEE - COMPLETE 4+ VIEW

[knee ap]
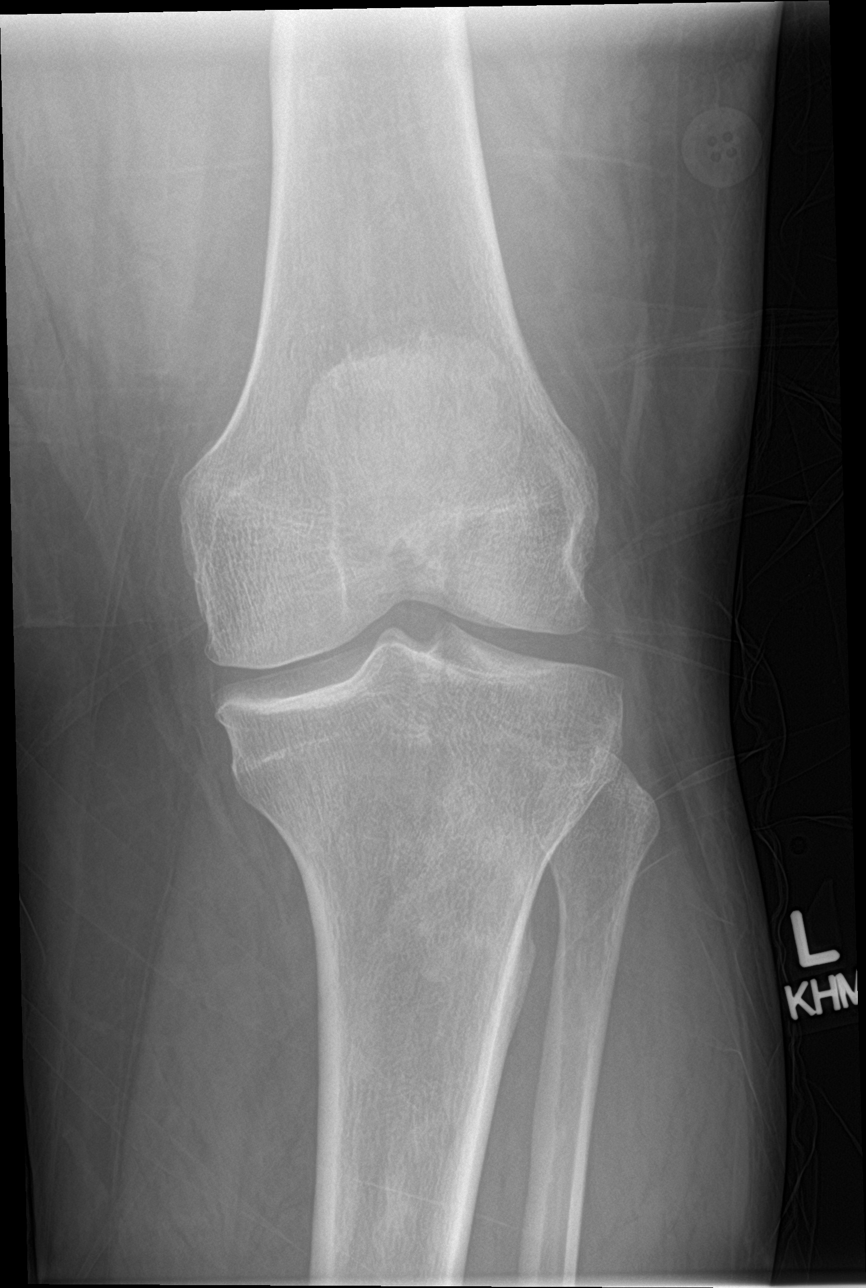

[tunnel]
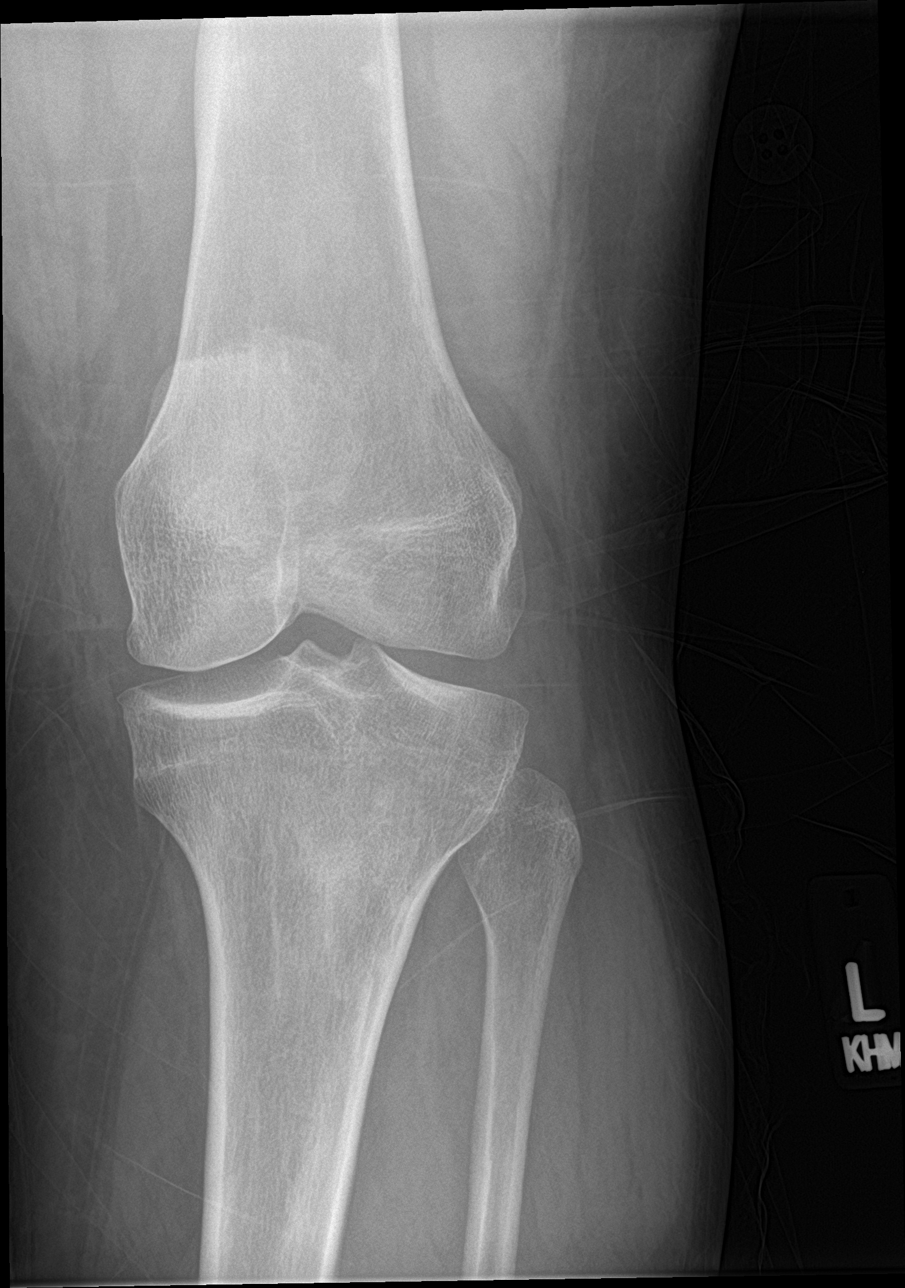

[knee lat (1 of 2)]
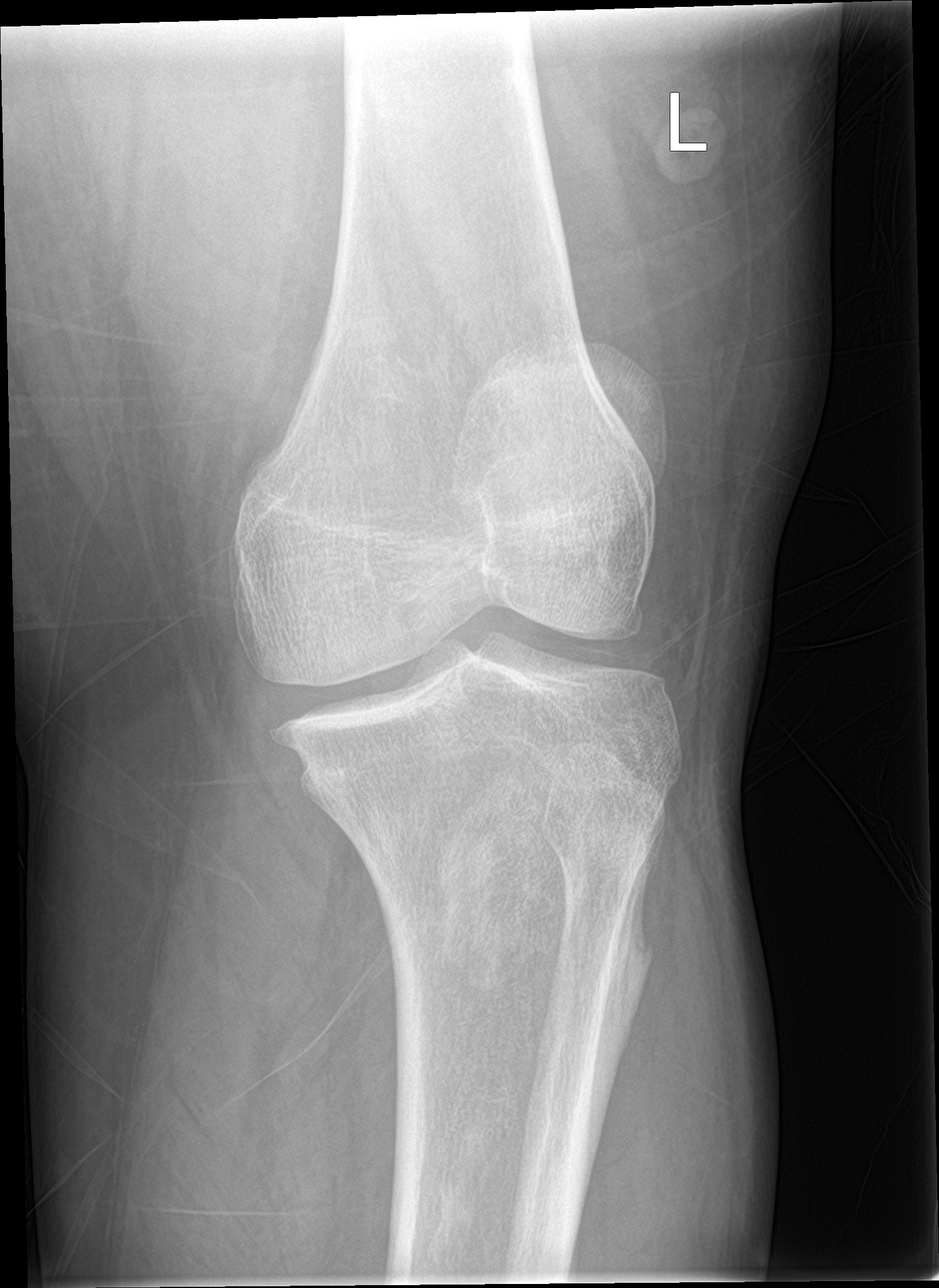

[knee lat (2 of 2)]
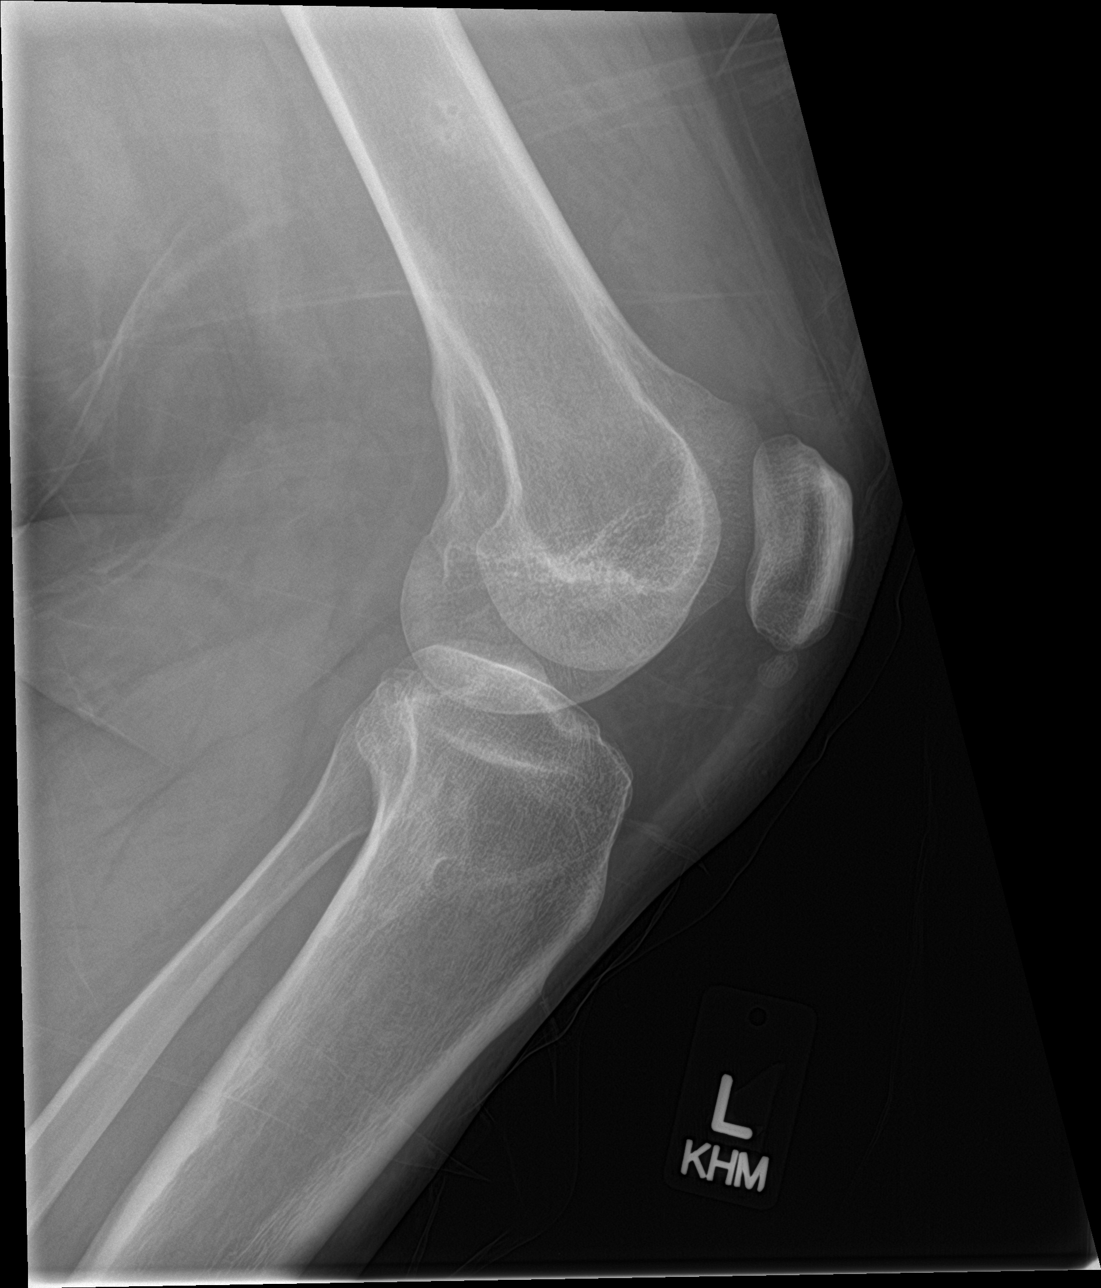

[4 of 4 positions shown; findings below may reference images not displayed]

FINDINGS: Left knee appears intact. No evidence of acute fracture or
subluxation. No focal bone lesion or bone destruction. Bone cortex
and trabecular architecture appear intact. Old ununited ossicle
inferior to the patella. No radiopaque soft tissue foreign bodies.
IMPRESSION: No acute bony abnormalities.
